# Patient Record
Sex: Male | Born: 1995 | Race: White | Hispanic: No | Marital: Single
Health system: Southern US, Community
[De-identification: ages and names within clinical notes are randomized; demographics above are authoritative.]

---

## 2020-11-15 ENCOUNTER — Emergency Department (HOSPITAL_COMMUNITY)
Admission: EM | Admit: 2020-11-15 | Discharge: 2020-11-15 | Disposition: A | Discharge status: Home / Self Care | Payer: BC Managed Care – PPO | Attending: Emergency Medicine | Admitting: Emergency Medicine

## 2020-11-15 ENCOUNTER — Emergency Department (HOSPITAL_COMMUNITY): Payer: BC Managed Care – PPO

## 2020-11-15 ENCOUNTER — Encounter (HOSPITAL_COMMUNITY): Payer: Self-pay | Admitting: Emergency Medicine

## 2020-11-15 DIAGNOSIS — S0240CA Maxillary fracture, right side, initial encounter for closed fracture: Secondary | ICD-10-CM | POA: Insufficient documentation

## 2020-11-15 DIAGNOSIS — Y9339 Activity, other involving climbing, rappelling and jumping off: Secondary | ICD-10-CM | POA: Diagnosis not present

## 2020-11-15 DIAGNOSIS — S01411A Laceration without foreign body of right cheek and temporomandibular area, initial encounter: Secondary | ICD-10-CM | POA: Insufficient documentation

## 2020-11-15 DIAGNOSIS — S0240EA Zygomatic fracture, right side, initial encounter for closed fracture: Secondary | ICD-10-CM | POA: Diagnosis not present

## 2020-11-15 DIAGNOSIS — S0181XA Laceration without foreign body of other part of head, initial encounter: Secondary | ICD-10-CM | POA: Insufficient documentation

## 2020-11-15 DIAGNOSIS — S01311A Laceration without foreign body of right ear, initial encounter: Secondary | ICD-10-CM | POA: Insufficient documentation

## 2020-11-15 DIAGNOSIS — Y92511 Restaurant or cafe as the place of occurrence of the external cause: Secondary | ICD-10-CM | POA: Insufficient documentation

## 2020-11-15 DIAGNOSIS — S02641A Fracture of ramus of right mandible, initial encounter for closed fracture: Secondary | ICD-10-CM

## 2020-11-15 DIAGNOSIS — S0993XA Unspecified injury of face, initial encounter: Secondary | ICD-10-CM | POA: Diagnosis present

## 2020-11-15 DIAGNOSIS — S02401A Maxillary fracture, unspecified, initial encounter for closed fracture: Secondary | ICD-10-CM

## 2020-11-15 MED ORDER — AMOXICILLIN-POT CLAVULANATE 875-125 MG PO TABS
1.0000 | ORAL_TABLET | Freq: Once | ORAL | Status: AC
  Administered 2020-11-15: 1 via ORAL
  Filled 2020-11-15: qty 1

## 2020-11-15 MED ORDER — OXYCODONE-ACETAMINOPHEN 5-325 MG PO TABS
1.0000 | ORAL_TABLET | Freq: Once | ORAL | Status: AC
  Administered 2020-11-15: 1 via ORAL
  Filled 2020-11-15: qty 1

## 2020-11-15 MED ORDER — IBUPROFEN 800 MG PO TABS: 800.0000 mg | ORAL_TABLET | Freq: Three times a day (TID) | ORAL | 0 refills | Status: AC

## 2020-11-15 MED ORDER — AMOXICILLIN-POT CLAVULANATE 875-125 MG PO TABS: 1.0000 | ORAL_TABLET | Freq: Two times a day (BID) | ORAL | 0 refills | Status: AC

## 2020-11-15 MED ORDER — DEXAMETHASONE SODIUM PHOSPHATE 10 MG/ML IJ SOLN
10.0000 mg | Freq: Once | INTRAMUSCULAR | Status: AC
  Administered 2020-11-15: 10 mg via INTRAMUSCULAR

## 2020-11-15 MED ORDER — DEXAMETHASONE SODIUM PHOSPHATE 10 MG/ML IJ SOLN
10.0000 mg | Freq: Once | INTRAMUSCULAR | Status: DC
  Filled 2020-11-15: qty 1

## 2020-11-15 MED ORDER — OXYCODONE-ACETAMINOPHEN 5-325 MG PO TABS
1.0000 | ORAL_TABLET | Freq: Once | ORAL | Status: AC
Start: 2020-11-15 — End: 2020-11-15
  Administered 2020-11-15: 1 via ORAL
  Filled 2020-11-15: qty 1

## 2020-11-15 MED ORDER — HYDROCODONE-ACETAMINOPHEN 5-325 MG PO TABS: 1.0000 | ORAL_TABLET | Freq: Four times a day (QID) | ORAL | 0 refills | Status: AC | PRN
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
## 2020-11-15 NOTE — ED Notes (Signed)
Patient to CT.
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
## 2020-11-15 NOTE — Discharge Instructions (Addendum)
Call the ear nose and throat doctor listed below to set up a follow-up appointment.  This should be done within the next week.  They will either see you on Friday or Monday. It is important that you keep ice on your jaw to prevent swelling.  You may do this with an ice pack or with frozen food. Eat a soft diet, only thing that you do not need to chew.  Do this until you can return to full diet per ENT. Take ibuprofen 3 times a day with meals.  Do not take other anti-inflammatories at the same time (Advil, Motrin, naproxen, Aleve). You may supplement with Tylenol if you need further pain control. Use Norco as needed for severe breakthrough pain.  Have caution, this may make you tired or groggy.  Do not drive or operate heavy machinery while taking this medicine. Take antibiotics as prescribed.  Take the entire course.   I included information about following up with a primary care doctor.  There are 2 main primary care clinics in the area, both listed below.  Return to the emergency room if you develop high fevers, severe worsening pain, or any new, worsening, or concerning symptoms.
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
